# Patient Record
Sex: Male | Born: 1990 | Race: Black or African American | Hispanic: No | Marital: Single | Smoking: Light tobacco smoker
Health system: Southern US, Community
[De-identification: ages and names within clinical notes are randomized; demographics above are authoritative.]

---

## 2013-11-06 ENCOUNTER — Encounter (HOSPITAL_COMMUNITY): Payer: Self-pay | Admitting: Emergency Medicine

## 2013-11-06 ENCOUNTER — Emergency Department (HOSPITAL_COMMUNITY): Payer: Self-pay

## 2013-11-06 ENCOUNTER — Emergency Department (HOSPITAL_COMMUNITY)
Admission: EM | Admit: 2013-11-06 | Discharge: 2013-11-06 | Disposition: A | Discharge status: Home / Self Care | Payer: Self-pay | Attending: Emergency Medicine | Admitting: Emergency Medicine

## 2013-11-06 DIAGNOSIS — F10929 Alcohol use, unspecified with intoxication, unspecified: Secondary | ICD-10-CM

## 2013-11-06 DIAGNOSIS — F17200 Nicotine dependence, unspecified, uncomplicated: Secondary | ICD-10-CM | POA: Insufficient documentation

## 2013-11-06 DIAGNOSIS — S01501A Unspecified open wound of lip, initial encounter: Secondary | ICD-10-CM | POA: Insufficient documentation

## 2013-11-06 DIAGNOSIS — Y929 Unspecified place or not applicable: Secondary | ICD-10-CM | POA: Insufficient documentation

## 2013-11-06 DIAGNOSIS — S0100XA Unspecified open wound of scalp, initial encounter: Secondary | ICD-10-CM | POA: Insufficient documentation

## 2013-11-06 DIAGNOSIS — S0990XA Unspecified injury of head, initial encounter: Secondary | ICD-10-CM | POA: Insufficient documentation

## 2013-11-06 DIAGNOSIS — S0101XA Laceration without foreign body of scalp, initial encounter: Secondary | ICD-10-CM

## 2013-11-06 DIAGNOSIS — Y939 Activity, unspecified: Secondary | ICD-10-CM | POA: Insufficient documentation

## 2013-11-06 DIAGNOSIS — R4182 Altered mental status, unspecified: Secondary | ICD-10-CM | POA: Insufficient documentation

## 2013-11-06 DIAGNOSIS — W19XXXA Unspecified fall, initial encounter: Secondary | ICD-10-CM | POA: Insufficient documentation

## 2013-11-06 DIAGNOSIS — F1010 Alcohol abuse, uncomplicated: Secondary | ICD-10-CM | POA: Insufficient documentation

## 2013-11-06 LAB — CBG MONITORING, ED: GLUCOSE-CAPILLARY: 86 mg/dL (ref 70–99)

## 2013-11-06 NOTE — ED Notes (Signed)
Patient complaint of posterior head laceration, upper lip laceration and abrasions to the right hand and fingers.  Patient's friend states he is intoxicated and fell.  Injuries were unwitnessed and patient can't remember what happened.

## 2013-11-06 NOTE — ED Notes (Signed)
Wound irrigated at this time.  

## 2013-11-06 NOTE — ED Provider Notes (Signed)
CSN: [id number]     Arrival date & time 11/06/13  0217 History   First MD Initiated Contact with Patient 11/06/13 [phone]     Chief Complaint  Patient presents with  . Head Laceration  . Lip Laceration     (Consider location/radiation/quality/duration/timing/severity/associated sxs/prior Treatment) HPI Comments: 23 year old male with no known medical history presents with scalp laceration and altered mental status. Per report from friends patient was intoxicated and fell and hit the back of his head. No further details known. No friends at bedside this report was from the nursing staff. Cannot obtain detailed history from patient at this time as clinically intoxicated.  Patient is a 23 y.o. male presenting with scalp laceration. The history is provided by the patient and a friend.  Head Laceration    History reviewed. No pertinent past medical history. History reviewed. No pertinent past surgical history. History reviewed. No pertinent family history. History  Substance Use Topics  . Smoking status: Light Tobacco Smoker    Types: Cigarettes  . Smokeless tobacco: Not on file  . Alcohol Use: Yes     Comment: states 4  cups of bicardi this morning    Review of Systems  Unable to perform ROS: Mental status change      Allergies  Review of patient's allergies indicates no known allergies.  Home Medications  No current outpatient prescriptions on file. BP 122/77  Pulse 69  Temp(Src) 98.6 F (37 C) (Oral)  Resp 20  SpO2 99% Physical Exam  Nursing note and vitals reviewed. Constitutional: He is oriented to person, place, and time. He appears well-developed and well-nourished.  HENT:  Head: Normocephalic.  Injected sclera bilateral Supple neck 5 cm laceration, gaping  Posterior scalp, mild bleeding  Eyes: Conjunctivae are normal. Right eye exhibits no discharge. Left eye exhibits no discharge.  Neck: Normal range of motion. Neck supple. No tracheal deviation present.   Cardiovascular: Normal rate and regular rhythm.   Pulmonary/Chest: Effort normal and breath sounds normal.  Abdominal: Soft. He exhibits no distension. There is no tenderness. There is no guarding.  Musculoskeletal: He exhibits tenderness. He exhibits no edema.  No tenderness to major joints or hands on palpation.  No step off noted.  No midline vertebral tenderness.  Neurological: He is alert and oriented to person, place, and time. GCS eye subscore is 3. GCS verbal subscore is 4. GCS motor subscore is 5.  perrl Moves ext equal bilateral with normal strength Sensation to palpation intact Difficult exam Clinically intoxicated  Skin: Skin is warm. No rash noted.    ED Course  Procedures (including critical care time) LACERATION REPAIR Performed by: Enid SkeensZAVITZ, Tiarna Koppen M Authorized by: Enid SkeensZAVITZ, Giovanna Kemmerer M Consent: Verbal consent obtained. Risks and benefits: risks, benefits and alternatives were discussed Consent given by: patient Patient identity confirmed: provided demographic data Prepped and Draped in normal sterile fashion Wound explored  Laceration Location: scalp Laceration Length: 5 cm No Foreign Bodies seen or palpated Anesthesia: local infiltration  Amount of cleaning: standard  Skin closure: approximated Number of sutures: 6 staples  interupted  Patient tolerance: Patient tolerated the procedure well with no immediate complications.   Labs Review Labs Reviewed  CBG MONITORING, ED   Imaging Review Ct Head Wo Contrast  11/06/2013   CLINICAL DATA:  Head laceration, unwitnessed fall.  EXAM: CT HEAD WITHOUT CONTRAST  CT CERVICAL SPINE WITHOUT CONTRAST  TECHNIQUE: Multidetector CT imaging of the head and cervical spine was performed following the standard protocol without intravenous contrast. Multiplanar  CT image reconstructions of the cervical spine were also generated.  COMPARISON:  None available for comparison at time of study interpretation.  FINDINGS: CT HEAD  FINDINGS  The ventricles and sulci are normal. No intraparenchymal hemorrhage, mass effect nor midline shift. No acute large vascular territory infarcts.  No abnormal extra-axial fluid collections. Basal cisterns are patent.  No skull fracture. Right parieto-occipital scalp defect may reflect laceration with subcutaneous gas. No radiopaque foreign bodies. Panparanasal sinus mucosal thickening. Bilateral auricular soft tissue calcifications. The included ocular globes and orbital contents are non-suspicious.  CT CERVICAL SPINE FINDINGS  Cervical vertebral bodies and posterior elements are intact and aligned with broad reversed cervical lordosis. Intervertebral disc heights preserved. No destructive bony lesions. C1-2 articulation maintained. Included prevertebral and paraspinal soft tissues are unremarkable. 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  IMPRESSION: CT head: Right parietal occipital scalp defect most consistent with laceration with subcutaneous gas, no radiopaque foreign bodies. No skull fracture or acute intracranial process.  Panparanasal sinusitis.  CT cervical spine: Broad reversed cervical lordosis without acute fracture nor malalignment.   Electronically Signed   By: Awilda Metro   On: 11/06/2013 03:52   Ct Cervical Spine Wo Contrast  11/06/2013   CLINICAL DATA:  Head laceration, unwitnessed fall.  EXAM: CT HEAD WITHOUT CONTRAST  CT CERVICAL SPINE WITHOUT CONTRAST  TECHNIQUE: Multidetector CT imaging of the head and cervical spine was performed following the standard protocol without intravenous contrast. Multiplanar CT image reconstructions of the cervical spine were also generated.  COMPARISON:  None available for comparison at time of study interpretation.  FINDINGS: CT HEAD FINDINGS  The ventricles and sulci are normal. No intraparenchymal hemorrhage, mass effect nor midline shift. No acute large vascular territory infarcts.  No abnormal extra-axial fluid collections. Basal  cisterns are patent.  No skull fracture. Right parieto-occipital scalp defect may reflect laceration with subcutaneous gas. No radiopaque foreign bodies. Panparanasal sinus mucosal thickening. Bilateral auricular soft tissue calcifications. The included ocular globes and orbital contents are non-suspicious.  CT CERVICAL SPINE FINDINGS  Cervical vertebral bodies and posterior elements are intact and aligned with broad reversed cervical lordosis. Intervertebral disc heights preserved. No destructive bony lesions. C1-2 articulation maintained. Included prevertebral and paraspinal soft tissues are unremarkable. 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  IMPRESSION: CT head: Right parietal occipital scalp defect most consistent with laceration with subcutaneous gas, no radiopaque foreign bodies. No skull fracture or acute intracranial process.  Panparanasal sinusitis.  CT cervical spine: Broad reversed cervical lordosis without acute fracture nor malalignment.   Electronically Signed   By: Awilda Metro   On: 11/06/2013 03:52     EKG Interpretation None      MDM   Final diagnoses:  Alcohol intoxication  Acute head injury  Scalp laceration   Patient arrived in the ED clinically intoxicated acute head injury and significant laceration to the scalp. Scalp wound irrigated by nursing staff. Staples used to repair wound laceration. Patient observed closely on the monitor until clinically sober. Patient woke up on his own walked around the room and asked for by mouth fluids. Oral fluids given. Second recheck patient improved clinically. Friends at bedside providing transportation home. Plan to discharge once patient walks around the room again improved she is no longer clinically intoxicated.  Results and differential diagnosis were discussed with the patient. Close follow up outpatient was discussed, patient comfortable with the plan.   Filed Vitals:   11/06/13 0430 11/06/13 0445 11/06/13 0500  11/06/13 0515  BP: 103/44 104/82 102/54 106/53  Pulse: 83 72 80 85  Temp:      TempSrc:      Resp:      SpO2: 93% 97% 97% 97%          Enid Skeens, MD 11/06/13 [phone]

## 2013-11-06 NOTE — ED Notes (Signed)
Triage notes and assessments documented under Francisco Capuchinavid Cooper but done by Greig CastillaAndrew Noya Santarelli-RN

## 2013-11-06 NOTE — Discharge Instructions (Signed)
Minimize alcohol intake. Keep wound clean, shower as normal. See a physician if you develop fevers spreading redness, pus draining or other concerns from her laceration. If you were given medicines take as directed.  If you are on coumadin or contraceptives realize their levels and effectiveness is altered by many different medicines.  If you have any reaction (rash, tongues swelling, other) to the medicines stop taking and see a physician.   Please follow up as directed and return to the ER or see a physician for new or worsening symptoms.  Thank you. Filed Vitals:   11/06/13 0430 11/06/13 0445 11/06/13 0500 11/06/13 0515  BP: 103/44 104/82 102/54 106/53  Pulse: 83 72 80 85  Temp:      TempSrc:      Resp:      SpO2: 93% 97% 97% 97%   Have staples removed in 10-14 days.

## 2015-04-09 IMAGING — CT CT HEAD W/O CM
4 of 5 series · 17 of 47 positions shown, 18 images · non-contrast
Comparison: None available for comparison at time of study
interpretation.

CLINICAL DATA: Head laceration, unwitnessed fall.

EXAM:
CT HEAD WITHOUT CONTRAST
CT CERVICAL SPINE WITHOUT CONTRAST
TECHNIQUE: Multidetector CT imaging of the head and cervical spine was
performed following the standard protocol without intravenous
contrast. Multiplanar CT image reconstructions of the cervical spine
were also generated.

[Series 2: head 5.0 h30s · axial · 0.43mm/px · z∈[-274,-199]mm · 3 of 31 slices shown, 4 images]
[im 8/31  brain]
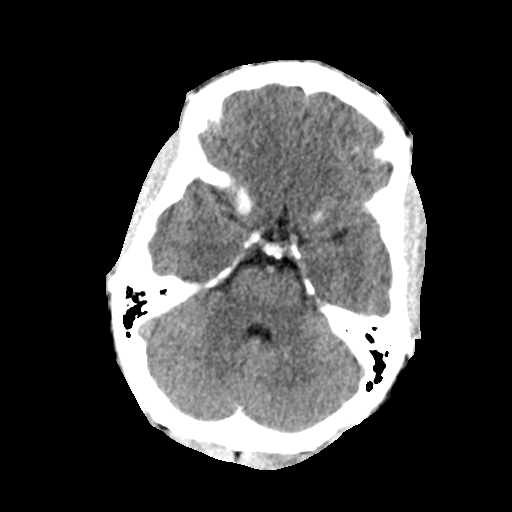
[im 8/31  bone]
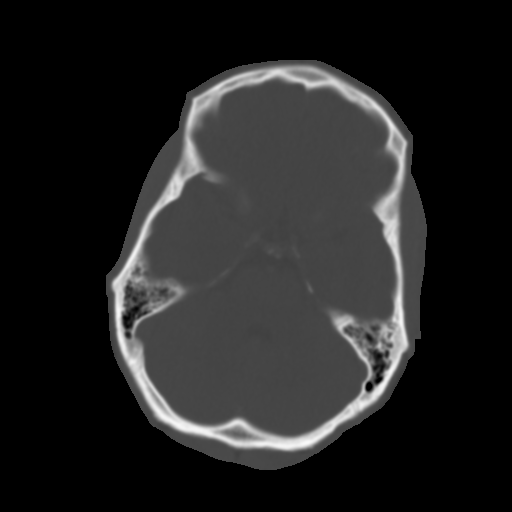
[im 16/31  brain]
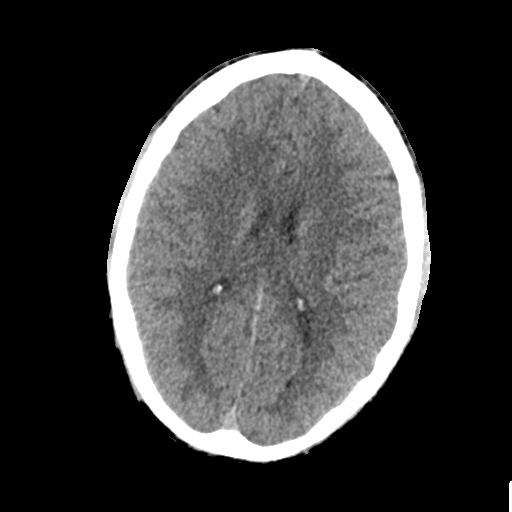
[im 23/31  brain]
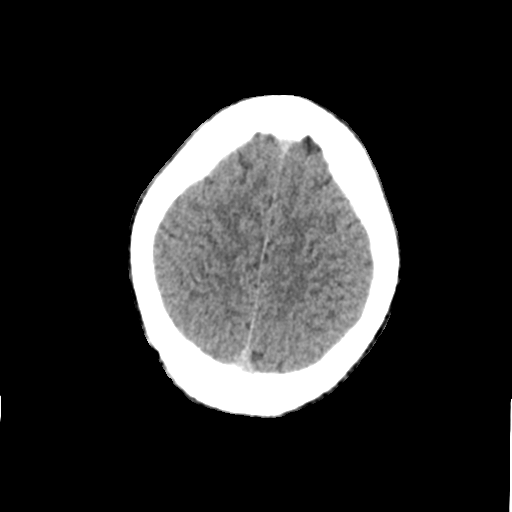

[Series 7: coronals · coronal · 0.36mm/px · 3 of 54 slices shown]
[im 18/54  brain]
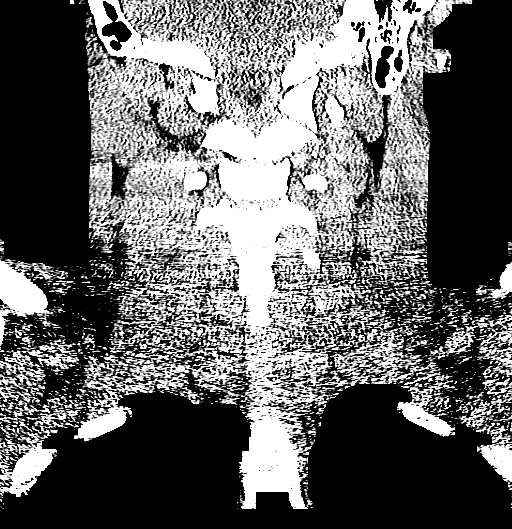
[im 24/54  brain]
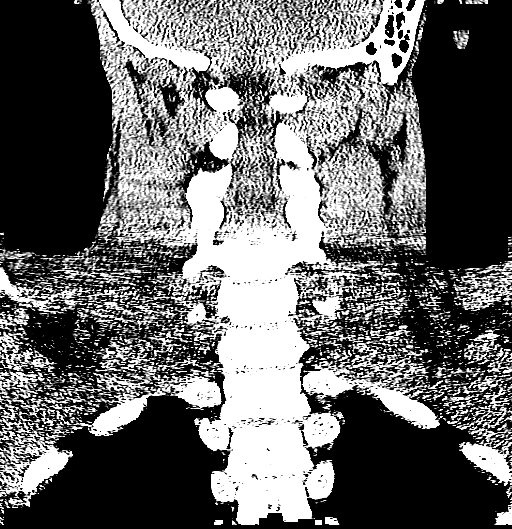
[im 30/54  brain]
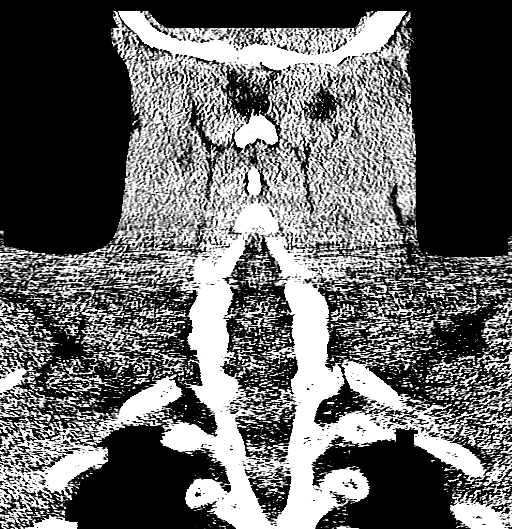

[Series 8: sagittals · sagittal · 0.33mm/px · 3 of 54 slices shown]
[im 18/54  brain]
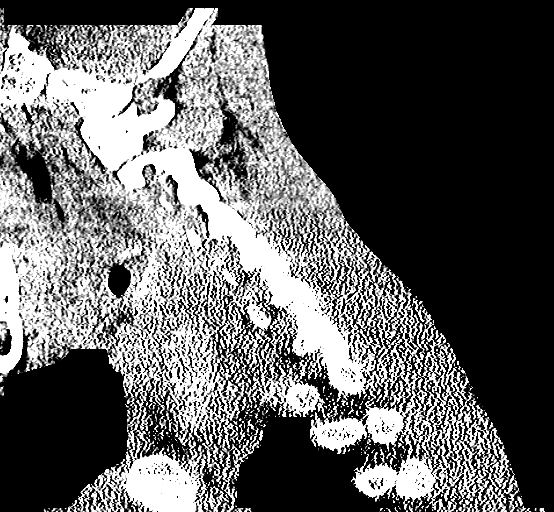
[im 27/54  brain]
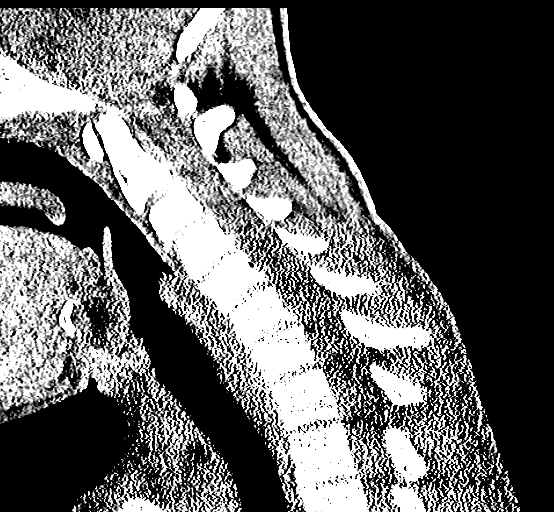
[im 36/54  brain]
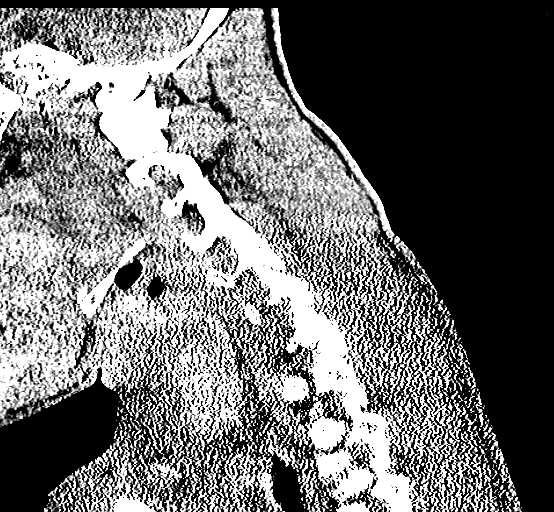

[Series 9: orthogonals · axial · 0.32mm/px · z∈[-421,-309]mm · 8 of 80 slices shown]
[im 8/80  brain]
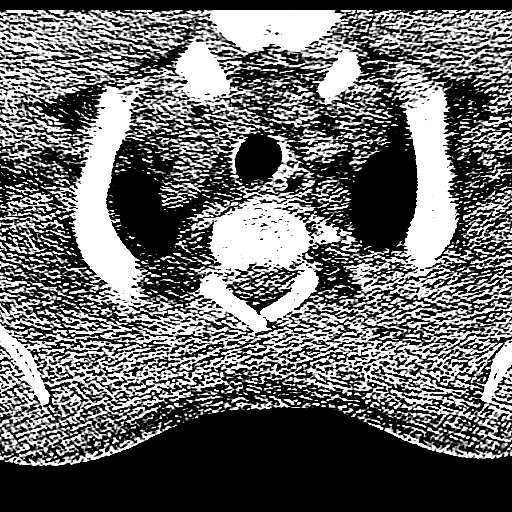
[im 15/80  brain]
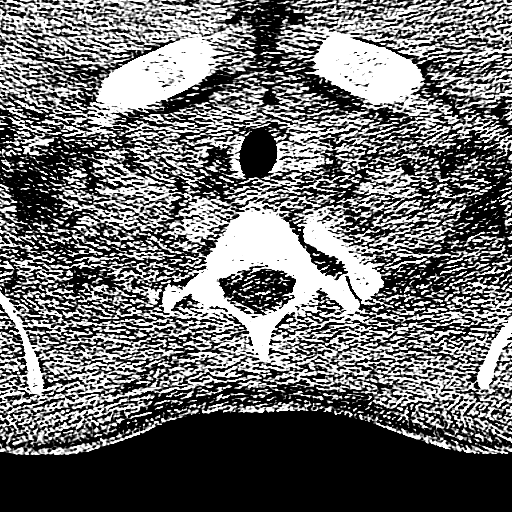
[im 29/80  brain]
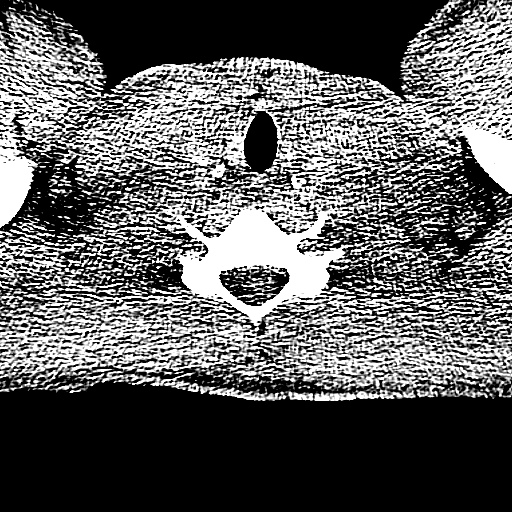
[im 36/80  brain]
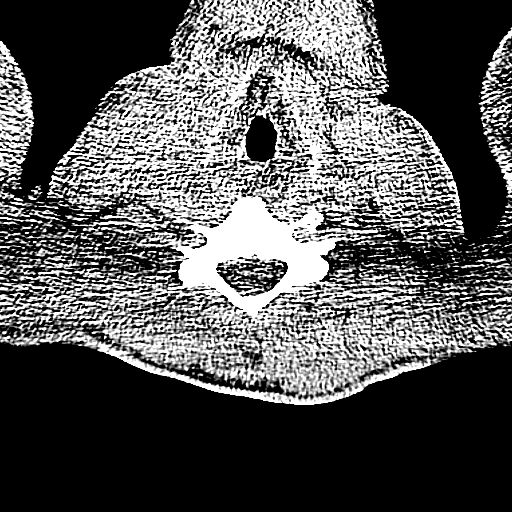
[im 44/80  brain]
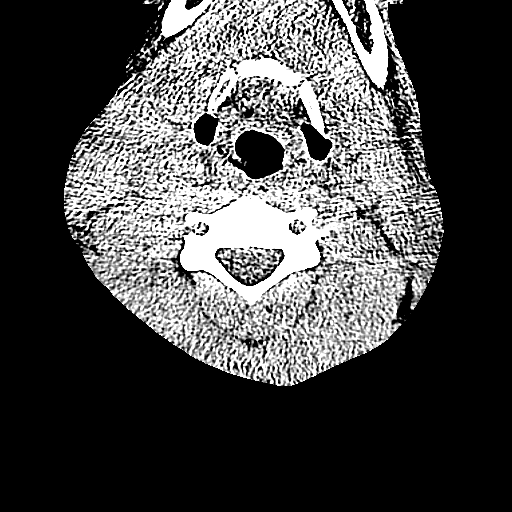
[im 51/80  brain]
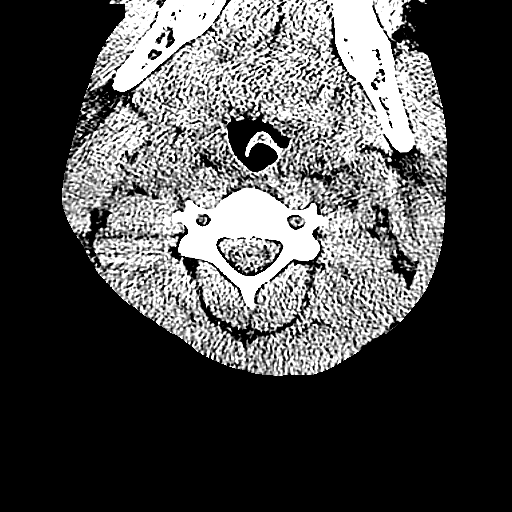
[im 65/80  brain]
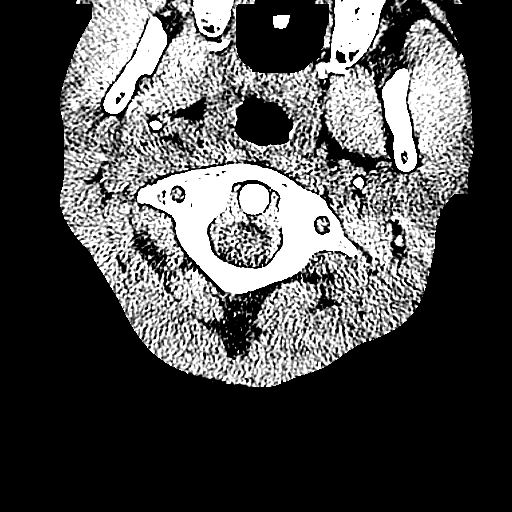
[im 72/80  brain]
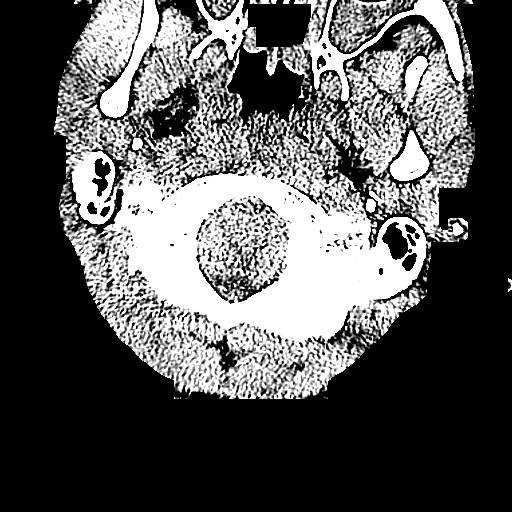

[17 of 47 positions shown; findings below may reference images not displayed]

FINDINGS: CT HEAD FINDINGS

The ventricles and sulci are normal. No intraparenchymal hemorrhage,
mass effect nor midline shift. No acute large vascular territory
infarcts.

No abnormal extra-axial fluid collections. Basal cisterns are
patent.

No skull fracture. Right parieto-occipital scalp defect may reflect
laceration with subcutaneous gas. No radiopaque foreign bodies.
Panparanasal sinus mucosal thickening. Bilateral auricular soft
tissue calcifications. The included ocular globes and orbital
contents are non-suspicious.

CT CERVICAL SPINE FINDINGS

Cervical vertebral bodies and posterior elements are intact and
aligned with broad reversed cervical lordosis. Intervertebral disc
heights preserved. No destructive bony lesions. C1-2 articulation
maintained. Included prevertebral and paraspinal soft tissues are
unremarkable. Spinous process at T2 is congenitally unfused.
IMPRESSION: CT head: Right parietal occipital scalp defect most consistent with
laceration with subcutaneous gas, no radiopaque foreign bodies. No
skull fracture or acute intracranial process.

Panparanasal sinusitis.

CT cervical spine: Broad reversed cervical lordosis without acute
fracture nor malalignment.

  By: Benrabah Etoil
